# Patient Record
Sex: Male | Born: 1960 | Race: White | Hispanic: No | Marital: Married | Smoking: Former smoker
Health system: Southern US, Community
[De-identification: ages and names within clinical notes are randomized; demographics above are authoritative.]

---

## 2015-04-20 ENCOUNTER — Encounter: Payer: Self-pay | Admitting: Emergency Medicine

## 2015-04-20 ENCOUNTER — Emergency Department: Payer: Self-pay

## 2015-04-20 ENCOUNTER — Emergency Department
Admission: EM | Admit: 2015-04-20 | Discharge: 2015-04-20 | Disposition: A | Discharge status: Home / Self Care | Payer: Self-pay | Attending: Emergency Medicine | Admitting: Emergency Medicine

## 2015-04-20 DIAGNOSIS — J069 Acute upper respiratory infection, unspecified: Secondary | ICD-10-CM | POA: Insufficient documentation

## 2015-04-20 DIAGNOSIS — J9801 Acute bronchospasm: Secondary | ICD-10-CM | POA: Insufficient documentation

## 2015-04-20 DIAGNOSIS — Z87891 Personal history of nicotine dependence: Secondary | ICD-10-CM | POA: Insufficient documentation

## 2015-04-20 LAB — CBC
HEMATOCRIT: 44.9 % (ref 40.0–52.0)
HEMOGLOBIN: 15.1 g/dL (ref 13.0–18.0)
MCH: 30.4 pg (ref 26.0–34.0)
MCHC: 33.7 g/dL (ref 32.0–36.0)
MCV: 90.5 fL (ref 80.0–100.0)
Platelets: 284 10*3/uL (ref 150–440)
RBC: 4.96 MIL/uL (ref 4.40–5.90)
RDW: 12.5 % (ref 11.5–14.5)
WBC: 9.3 10*3/uL (ref 3.8–10.6)

## 2015-04-20 LAB — COMPREHENSIVE METABOLIC PANEL
ALBUMIN: 4.2 g/dL (ref 3.5–5.0)
ALK PHOS: 68 U/L (ref 38–126)
ALT: 19 U/L (ref 17–63)
ANION GAP: 5 (ref 5–15)
AST: 20 U/L (ref 15–41)
BILIRUBIN TOTAL: 0.6 mg/dL (ref 0.3–1.2)
BUN: 15 mg/dL (ref 6–20)
CALCIUM: 8.9 mg/dL (ref 8.9–10.3)
CO2: 27 mmol/L (ref 22–32)
Chloride: 108 mmol/L (ref 101–111)
Creatinine, Ser: 0.97 mg/dL (ref 0.61–1.24)
GFR calc Af Amer: >60 mL/min (ref >60)
GFR calc non Af Amer: >60 mL/min (ref >60)
GLUCOSE: 113 mg/dL — AB (ref 65–99)
POTASSIUM: 4.3 mmol/L (ref 3.5–5.1)
SODIUM: 140 mmol/L (ref 135–145)
TOTAL PROTEIN: 7.6 g/dL (ref 6.5–8.1)

## 2015-04-20 LAB — TROPONIN I: Troponin I: 0.03 ng/mL (ref <0.031)

## 2015-04-20 MED ORDER — IPRATROPIUM-ALBUTEROL 0.5-2.5 (3) MG/3ML IN SOLN
3.0000 mL | Freq: Once | RESPIRATORY_TRACT | Status: AC
  Administered 2015-04-20: 3 mL via RESPIRATORY_TRACT

## 2015-04-20 MED ORDER — ALBUTEROL SULFATE HFA 108 (90 BASE) MCG/ACT IN AERS: 2.0000 | INHALATION_SPRAY | Freq: Four times a day (QID) | RESPIRATORY_TRACT | Status: AC | PRN

## 2015-04-20 MED ORDER — METHYLPREDNISOLONE SODIUM SUCC 125 MG IJ SOLR
INTRAMUSCULAR | Status: AC
  Filled 2015-04-20: qty 2

## 2015-04-20 MED ORDER — ALBUTEROL SULFATE (2.5 MG/3ML) 0.083% IN NEBU
2.5000 mg | INHALATION_SOLUTION | Freq: Once | RESPIRATORY_TRACT | Status: AC
  Administered 2015-04-20: 2.5 mg via RESPIRATORY_TRACT

## 2015-04-20 MED ORDER — PREDNISONE 50 MG PO TABS: 50.0000 mg | ORAL_TABLET | Freq: Every day | ORAL | Status: AC

## 2015-04-20 MED ORDER — IPRATROPIUM-ALBUTEROL 0.5-2.5 (3) MG/3ML IN SOLN
RESPIRATORY_TRACT | Status: AC
  Filled 2015-04-20: qty 9

## 2015-04-20 MED ORDER — ALBUTEROL SULFATE (2.5 MG/3ML) 0.083% IN NEBU
INHALATION_SOLUTION | RESPIRATORY_TRACT | Status: AC
  Filled 2015-04-20: qty 3

## 2015-04-20 MED ORDER — METHYLPREDNISOLONE SODIUM SUCC 125 MG IJ SOLR
125.0000 mg | Freq: Once | INTRAMUSCULAR | Status: AC
  Administered 2015-04-20: 125 mg via INTRAVENOUS

## 2015-04-20 NOTE — ED Provider Notes (Signed)
[facility] Emergency Department Provider Note  ____________________________________________  Time seen: On arrival  I have reviewed the triage vital signs and the nursing notes.   HISTORY  Chief Complaint Cough and Shortness of Breath    HPI Jeffrey Yu is a 54 y.o. male who presents with complaints of cough and shortness of breath over the last 2 days. He reports his breathing became worse today and his chest felt tight. He also complains of wheezing sensation. He denies fevers chills. He reports a similar episode one year ago that resolved on its own. He does not smoke but he did 27 years ago. He has no history of asthma. He denies recent travel or calf pain or lower extremity swelling.No sick contacts     History reviewed. No pertinent past medical history.  There are no active problems to display for this patient.   History reviewed. No pertinent past surgical history.  No current outpatient prescriptions on file.  Allergies Review of patient's allergies indicates no known allergies.  No family history on file.  Social History Social History  Substance Use Topics  . Smoking status: Former Games developermoker  . Smokeless tobacco: None  . Alcohol Use: Yes    Review of Systems  Constitutional: Negative for fever. Eyes: Negative for visual changes. ENT: Negative for sore throat Cardiovascular: Negative for chest pain. Respiratory: positive shortness of breath, positive cough  Gastrointestinal: Negative for abdominal pain, vomiting and diarrhea. Genitourinary: Negative for dysuria. Musculoskeletal: Negative for back pain. Skin: Negative for rash. Neurological: Negative for headaches or focal weakness Psychiatric: no anxiety  ___________________________________________   PHYSICAL EXAM:  VITAL SIGNS: ED Triage Vitals  Enc Vitals Group     BP 04/20/15 1544 163/102 mmHg     Pulse Rate 04/20/15 1544 71     Resp 04/20/15 1544 20     Temp  04/20/15 1544 98.3 F (36.8 C)     Temp Source 04/20/15 1544 Oral     SpO2 04/20/15 1544 93 %     Weight 04/20/15 1544 195 lb (88.451 kg)     Height 04/20/15 1544 6' (1.829 m)     Head Cir --      Peak Flow --      Pain Score 04/20/15 1536 0     Pain Loc --      Pain Edu? --      Excl. in GC? --      Constitutional: Alert and oriented. Well appearing and in no distress. Eyes: Conjunctivae are normal.  ENT   Head: Normocephalic and atraumatic.   Mouth/Throat: Mucous membranes are moist. Cardiovascular: Normal rate, regular rhythm. Normal and symmetric distal pulses are present in all extremities. No murmurs, rubs, or gallops. Respiratory: Normal respiratory effort without tachypnea nor retraction Patient is wheezing diffusely Gastrointestinal: Soft and non-tender in all quadrants. No distention. There is no CVA tenderness. Genitourinary: deferred Musculoskeletal: Nontender with normal range of motion in all extremities. No lower extremity tenderness nor edema. Neurologic:  Normal speech and language. No gross focal neurologic deficits are appreciated. Skin:  Skin is warm, dry and intact. No rash noted. Psychiatric: Mood and affect are normal. Patient exhibits appropriate insight and judgment.  ____________________________________________    LABS (pertinent positives/negatives)  Labs Reviewed  COMPREHENSIVE METABOLIC PANEL - Abnormal; Notable for the following:    Glucose, Bld 113 (*)    All other components within normal limits  CBC  TROPONIN I    ____________________________________________   EKG  ED ECG  REPORT I, Jene Every, the attending physician, personally viewed and interpreted this ECG.   Date: 04/20/2015  EKG Time: 3:38 PM  Rate: 72  Rhythm: normal EKG, normal sinus rhythm  Axis: Normal  Intervals:none  ST&T Change: Nonspecific   ____________________________________________    RADIOLOGY I have personally reviewed any xrays that were  ordered on this patient: Chest x-ray unremarkable ____________________________________________   PROCEDURES  Procedure(s) performed: none  Critical Care performed: none  ____________________________________________   INITIAL IMPRESSION / ASSESSMENT AND PLAN / ED COURSE  Pertinent labs & imaging results that were available during my care of the patient were reviewed by me and considered in my medical decision making (see chart for details).   patient presents with diffuse wheezing consistent with bronchospasm of unknown etiology. Suspect upper respiratory infection versus undiagnosed COPD. we will give nebulizers and steroids IV and reevaluate   ----------------------------------------- 5:40 PM on 04/20/2015 -----------------------------------------  Patient's wheezing is improved slightly although he continues to have wheezes in both lungs. I recommended admission for the patient but he declined stating that he is not willing to stay in the hospital. Because of this we will treat him with albuterol inhalers and prednisone as an outpatient. He knows to return to the emergency department immediately if he tilts any worsening shortness of breath. He understands the risk of going home and not stay in the hospital  ._________________________________________   FINAL CLINICAL IMPRESSION(S) / ED DIAGNOSES  Final diagnoses:  Bronchospasm, acute  Upper respiratory infection     Jene Every, MD 04/20/15 1740

## 2015-04-20 NOTE — Discharge Instructions (Signed)
Bronchospasm, Adult A bronchospasm is when the tubes that carry air in and out of your lungs (airways) spasm or tighten. During a bronchospasm it is hard to breathe. This is because the airways get smaller. A bronchospasm can be triggered by:  Allergies. These may be to animals, pollen, food, or mold.  Infection. This is a common cause of bronchospasm.  Exercise.  Irritants. These include pollution, cigarette smoke, strong odors, aerosol sprays, and paint fumes.  Weather changes.  Stress.  Being emotional. HOME CARE   Always have a plan for getting help. Know when to call your doctor and local emergency services (911 in the [place] Know where you can get emergency care.  Only take medicines as told by your doctor.  If you were prescribed an inhaler or nebulizer machine, ask your doctor how to use it correctly. Always use a spacer with your inhaler if you were given one.  Stay calm during an attack. Try to relax and breathe more slowly.  Control your home environment:  Change your heating and air conditioning filter at least once a month.  Limit your use of fireplaces and wood stoves.  Do not  smoke. Do not  allow smoking in your home.  Avoid perfumes and fragrances.  Get rid of pests (such as roaches and mice) and their droppings.  Throw away plants if you see mold on them.  Keep your house clean and dust free.  Replace carpet with wood, tile, or vinyl flooring. Carpet can trap dander and dust.  Use allergy-proof pillows, mattress covers, and box spring covers.  Wash bed sheets and blankets every week in hot water. Dry them in a dryer.  Use blankets that are made of polyester or cotton.  Wash hands frequently. GET HELP IF:  You have muscle aches.  You have chest pain.  The thick spit you spit or cough up (sputum) changes from clear or white to yellow, green, gray, or bloody.  The thick spit you spit or cough up gets thicker.  There are problems that may be  related to the medicine you are given such as:  A rash.  Itching.  Swelling.  Trouble breathing. GET HELP RIGHT AWAY IF:  You feel you cannot breathe or catch your breath.  You cannot stop coughing.  Your treatment is not helping you breathe better.  You have very bad chest pain. MAKE SURE YOU:   Understand these instructions.  Will watch your condition.  Will get help right away if you are not doing well or get worse.   This information is not intended to replace advice given to you by your health care provider. Make sure you discuss any questions you have with your health care provider.   Document Released: 04/04/2009 Document Revised: 06/28/2014 Document Reviewed: 11/28/2012 Elsevier Interactive Patient Education 2016 [employer]  

## 2015-04-20 NOTE — ED Notes (Signed)
Pt states he has had productive cough for a couple of days, states today he laid back flat and became more sob and his chest felt tight, states he also became diaphoretic, denies any tightness in chest now

## 2015-04-20 NOTE — ED Notes (Signed)
Pt has been coughing for a while, pt states that yesterday while at work he started pouring sweat and felt like he was going to die, pt states that he feels like he can only take an incomplete breath with difficulty breathing. Pt has inspiratory and expiratory wheezes, pt sounds congestion in his nose and is unable to breathe out of his nose

## 2015-09-20 DEATH — deceased

## 2016-05-09 IMAGING — CR DG CHEST 2V
2 series · 2 of 2 positions shown · non-contrast
Comparison: None.

CLINICAL DATA: Cough, shortness of breath

EXAM:
CHEST  2 VIEW

[chest pa]
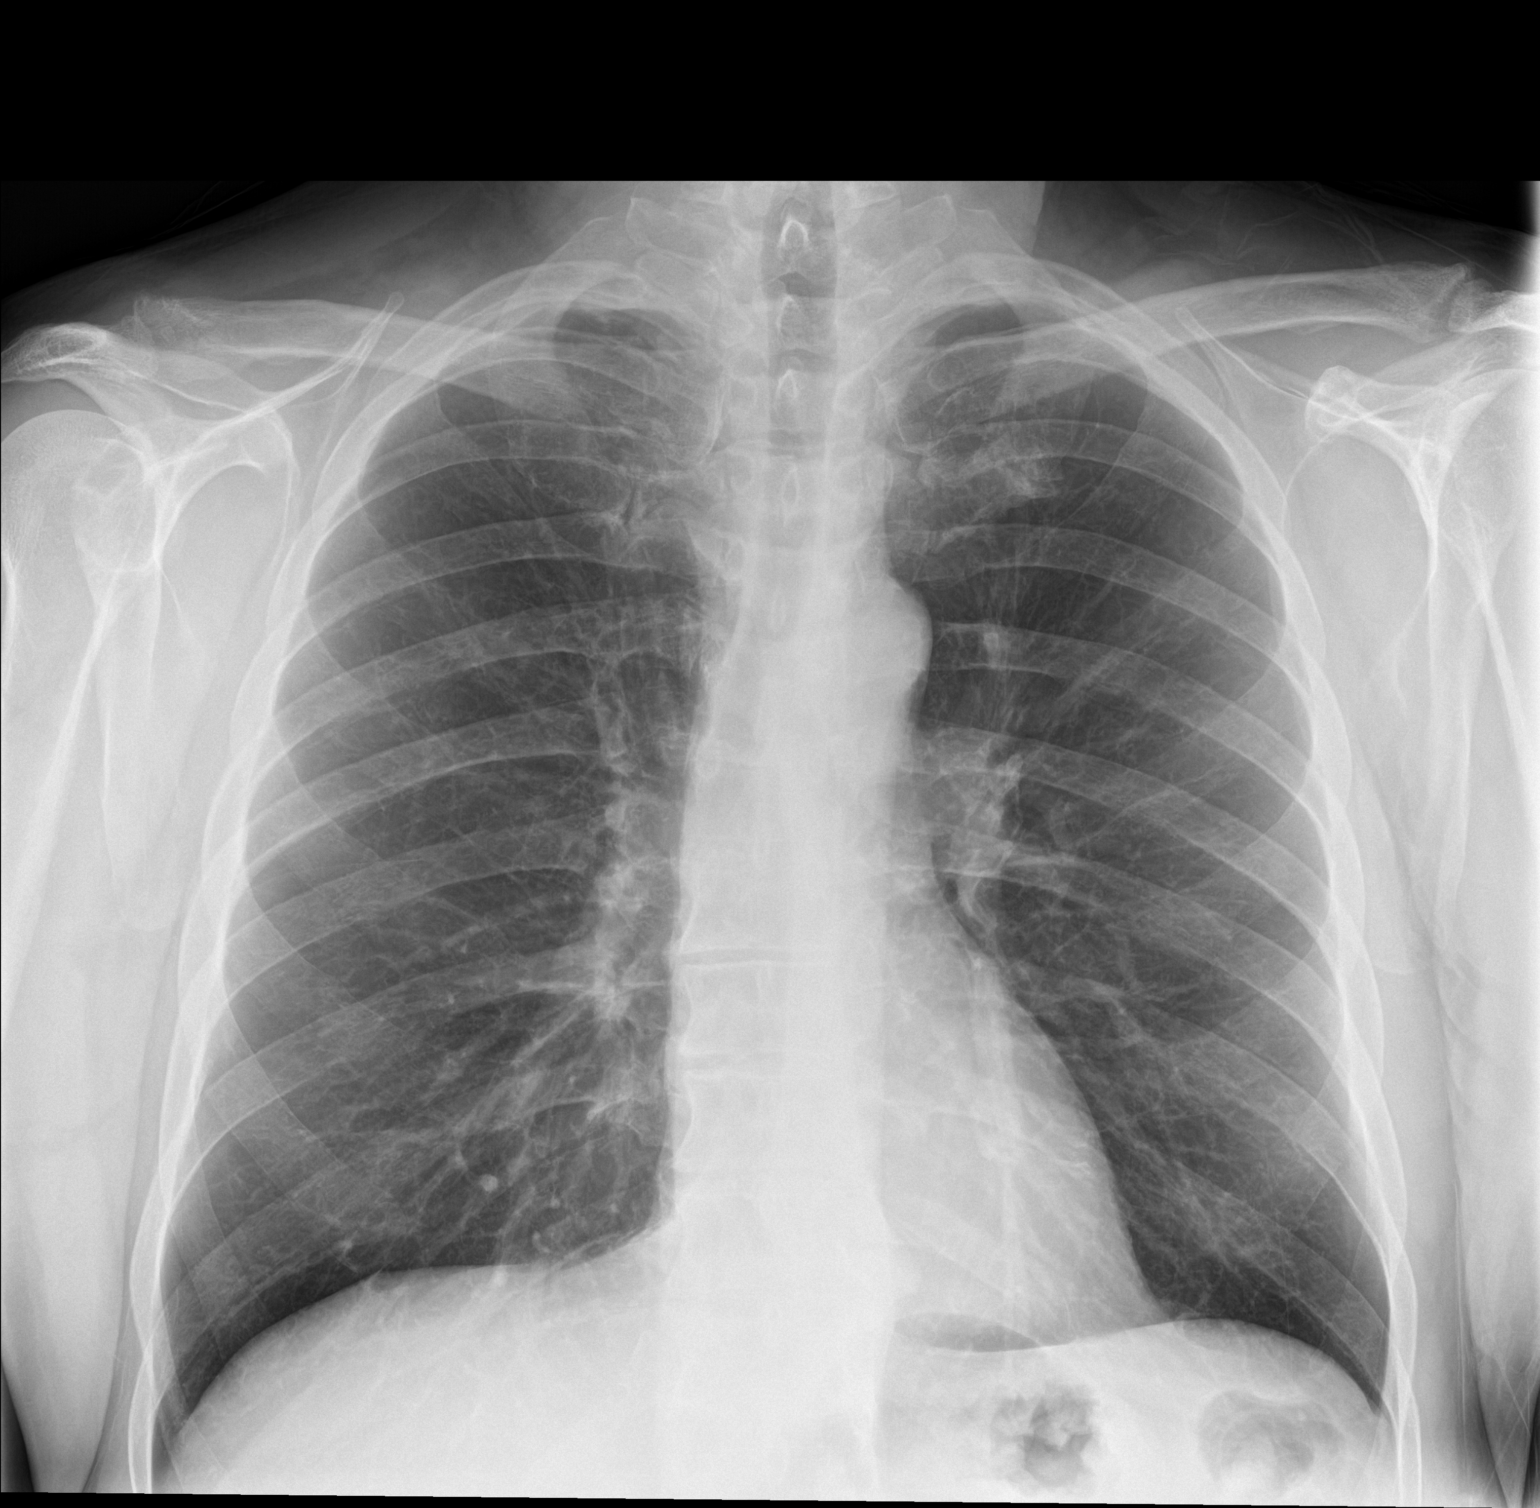

[chest lat]
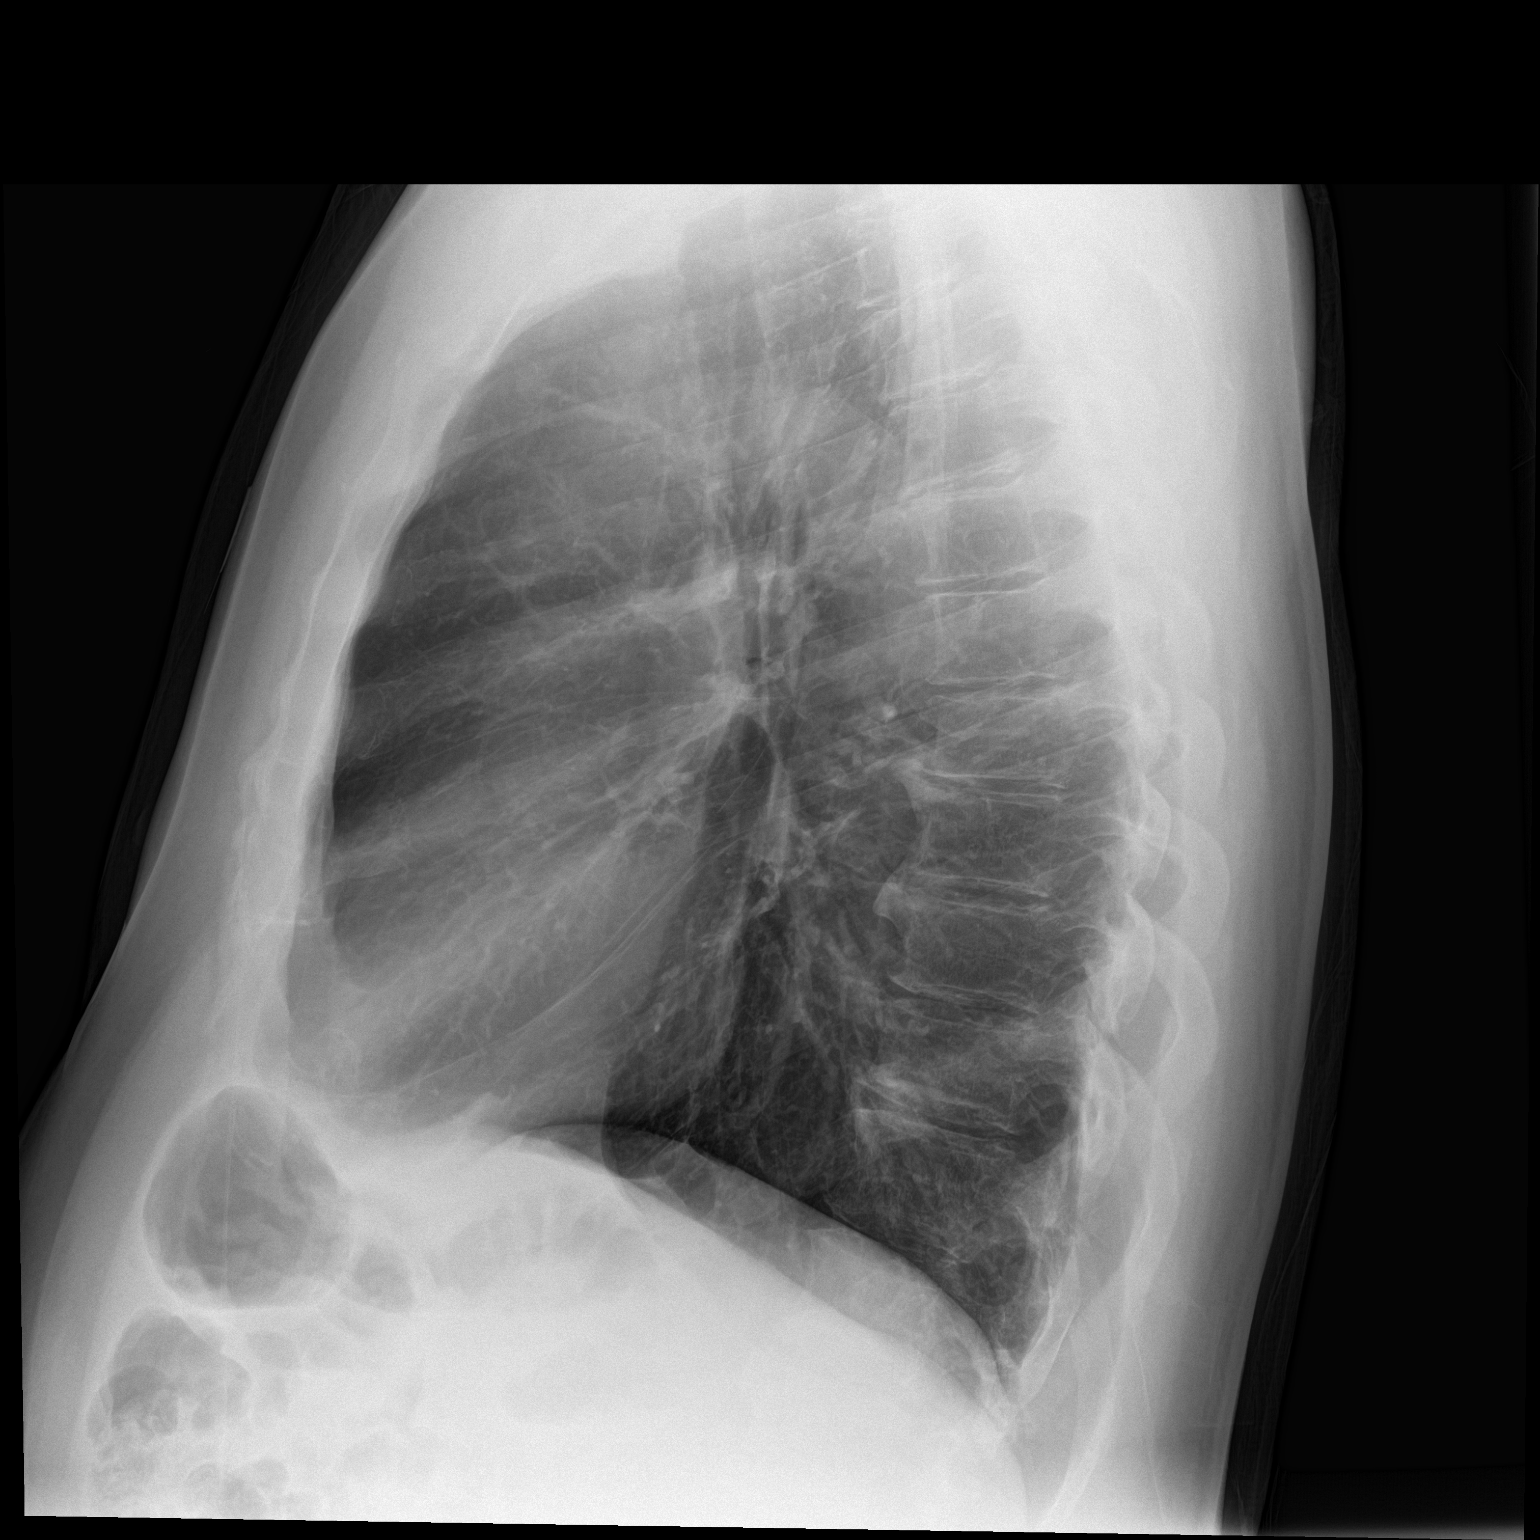

[2 of 2 positions shown; findings below may reference images not displayed]

FINDINGS: Lungs are clear.  No pleural effusion or pneumothorax.

The heart is normal in size.

Degenerative changes of the visualized thoracolumbar spine.
IMPRESSION: No evidence of acute cardiopulmonary disease.
